# Patient Record
Sex: Male | Born: 1995 | Race: Black or African American | Hispanic: No | Marital: Married | Smoking: Never smoker
Health system: Southern US, Community
[De-identification: ages and names within clinical notes are randomized; demographics above are authoritative.]

## PROBLEM LIST (undated history)

## (undated) DIAGNOSIS — F419 Anxiety disorder, unspecified: Secondary | ICD-10-CM

---

## 2015-07-16 ENCOUNTER — Emergency Department (HOSPITAL_BASED_OUTPATIENT_CLINIC_OR_DEPARTMENT_OTHER)
Admission: EM | Admit: 2015-07-16 | Discharge: 2015-07-16 | Disposition: A | Discharge status: Home / Self Care | Payer: Medicaid - Out of State | Attending: Emergency Medicine | Admitting: Emergency Medicine

## 2015-07-16 ENCOUNTER — Encounter (HOSPITAL_BASED_OUTPATIENT_CLINIC_OR_DEPARTMENT_OTHER): Payer: Self-pay | Admitting: *Deleted

## 2015-07-16 ENCOUNTER — Emergency Department (HOSPITAL_BASED_OUTPATIENT_CLINIC_OR_DEPARTMENT_OTHER): Payer: Medicaid - Out of State

## 2015-07-16 DIAGNOSIS — R42 Dizziness and giddiness: Secondary | ICD-10-CM | POA: Diagnosis not present

## 2015-07-16 DIAGNOSIS — R202 Paresthesia of skin: Secondary | ICD-10-CM | POA: Insufficient documentation

## 2015-07-16 DIAGNOSIS — R200 Anesthesia of skin: Secondary | ICD-10-CM | POA: Diagnosis present

## 2015-07-16 LAB — BASIC METABOLIC PANEL
ANION GAP: 6 (ref 5–15)
BUN: 10 mg/dL (ref 6–20)
CO2: 29 mmol/L (ref 22–32)
Calcium: 8.8 mg/dL — ABNORMAL LOW (ref 8.9–10.3)
Chloride: 100 mmol/L — ABNORMAL LOW (ref 101–111)
Creatinine, Ser: 0.95 mg/dL (ref 0.61–1.24)
GFR calc Af Amer: >60 mL/min (ref >60)
Glucose, Bld: 94 mg/dL (ref 65–99)
POTASSIUM: 3.6 mmol/L (ref 3.5–5.1)
SODIUM: 135 mmol/L (ref 135–145)

## 2015-07-16 LAB — CBC
HEMATOCRIT: 42 % (ref 39.0–52.0)
HEMOGLOBIN: 14.3 g/dL (ref 13.0–17.0)
MCH: 30.2 pg (ref 26.0–34.0)
MCHC: 34 g/dL (ref 30.0–36.0)
MCV: 88.6 fL (ref 78.0–100.0)
Platelets: 196 10*3/uL (ref 150–400)
RBC: 4.74 MIL/uL (ref 4.22–5.81)
RDW: 12 % (ref 11.5–15.5)
WBC: 3.5 10*3/uL — AB (ref 4.0–10.5)

## 2015-07-16 LAB — TROPONIN I: Troponin I: <0.03 ng/mL (ref <0.031)

## 2015-07-16 NOTE — ED Notes (Signed)
Pt's grip equal but weak, pt reported feeling like was going to pass out while holding arms out at 90 degrees, no drift noted; facial symmetry present

## 2015-07-16 NOTE — ED Provider Notes (Signed)
CSN: [id number]     Arrival date & time 07/16/15  1605 History   First MD Initiated Contact with Patient 07/16/15 1735     Chief Complaint  Patient presents with  . Numbness     (Consider location/radiation/quality/duration/timing/severity/associated sxs/prior Treatment) HPI Comments: Patient with no significant past medical history presents with complaints of acute onset of paresthesias in his face, left arm, left chest, left leg approximately 3 PM after getting out of the shower. Symptoms lasted for about 30 minutes. Patient was able to get up and walk and talk with his wife at this time. He did have some discomfort in the left side of his chest that he describes as "feeling like he was going to collapse". He denies any pain. Denies headache or blurry vision/vision loss. No neck pain, neck injuries, manipulations. He states that he has never had tingling like this before, however but had similar chest symptoms 4 years ago. No family history of heart disease or stroke, especially at young age. Symptoms are now completely resolved except for residual tingling in the fingers. No treatments prior to arrival. Patient denies signs of stroke including: facial droop, slurred speech, aphasia, profound weakness in extremities, imbalance/trouble walking.   The history is provided by the patient.    History reviewed. No pertinent past medical history. History reviewed. No pertinent past surgical history. No family history on file. Social History  Substance Use Topics  . Smoking status: Never Smoker   . Smokeless tobacco: Never Used  . Alcohol Use: No    Review of Systems  Constitutional: Negative for fever.  HENT: Negative for congestion, dental problem, rhinorrhea and sinus pressure.   Eyes: Negative for photophobia, discharge, redness and visual disturbance.  Respiratory: Negative for shortness of breath.   Cardiovascular: Negative for chest pain.  Gastrointestinal: Negative for nausea and  vomiting.  Musculoskeletal: Negative for gait problem, neck pain and neck stiffness.  Skin: Negative for rash.  Neurological: Positive for light-headedness and numbness (paresthesias, no true numbness). Negative for syncope, speech difficulty, weakness and headaches.  Psychiatric/Behavioral: Negative for confusion.      Allergies  Review of patient's allergies indicates no known allergies.  Home Medications   Prior to Admission medications   Not on File   BP 122/75 mmHg  Pulse 57  Temp(Src) 98.1 F (36.7 C) (Oral)  Resp 18  Ht  (1.626 m)  Wt 58.06 kg  BMI 21.96 kg/m2  SpO2 99% Physical Exam  Constitutional: He is oriented to person, place, and time. He appears well-developed and well-nourished.  HENT:  Head: Normocephalic and atraumatic.  Right Ear: Tympanic membrane, external ear and ear canal normal.  Left Ear: Tympanic membrane, external ear and ear canal normal.  Nose: Nose normal.  Mouth/Throat: Uvula is midline, oropharynx is clear and moist and mucous membranes are normal.  Eyes: Conjunctivae, EOM and lids are normal. Pupils are equal, round, and reactive to light.  Neck: Normal range of motion. Neck supple.  Cardiovascular: Normal rate and regular rhythm.   Pulmonary/Chest: Effort normal and breath sounds normal.  Abdominal: Soft. There is no tenderness.  Musculoskeletal: Normal range of motion.       Cervical back: He exhibits normal range of motion, no tenderness and no bony tenderness.  Neurological: He is alert and oriented to person, place, and time. He has normal strength and normal reflexes. No cranial nerve deficit or sensory deficit. He exhibits normal muscle tone. He displays a negative Romberg sign. Coordination and gait normal.  GCS eye subscore is 4. GCS verbal subscore is 5. GCS motor subscore is 6.  Skin: Skin is warm and dry.  Psychiatric: He has a normal mood and affect.  Nursing note and vitals reviewed.   ED Course  Procedures (including  critical care time) Labs Review Labs Reviewed  BASIC METABOLIC PANEL - Abnormal; Notable for the following:    Chloride 100 (*)    Calcium 8.8 (*)    All other components within normal limits  CBC - Abnormal; Notable for the following:    WBC 3.5 (*)    All other components within normal limits  TROPONIN I    Imaging Review No results found. I have personally reviewed and evaluated these images and lab results as part of my medical decision-making.   EKG Interpretation   Date/Time:  Saturday July 16 2015 16:44:54 EST Ventricular Rate:  58 PR Interval:  203 QRS Duration: 83 QT Interval:  400 QTC Calculation: 393 R Axis:   64 Text Interpretation:  Sinus rhythm Borderline prolonged PR interval ST  elev, probable normal early repol pattern No old tracing to compare  Confirmed by [facility]  MD, MARTHA [phone]) on 07/16/2015 5:11:17 PM       5:47 PM Patient seen and examined. Work-up initiated.    Vital signs reviewed and are as follows: BP 122/75 mmHg  Pulse 57  Temp(Src) 98.1 F (36.7 C) (Oral)  Resp 18  Ht  (1.626 m)  Wt 58.06 kg  BMI 21.96 kg/m2  SpO2 99%  Discussed work-up with Dr. Karma Ganja who agrees with plan. Low suspicion for CNS cause such as CVA. No focal deficits or risk factors. As symptoms are resolved, feel d/c with PCP f/u is reasonable. Pt is comfortable with this plan as well. Waiting results of chest x-ray. Will need PCP referral as patient has recently moved to the area from [place].  7:11 PM Symptoms not completely resolved. Discussed findings with patient. He is comfortable with discharge to home at this time. He will follow-up with PCP.  Patient counseled to return if they have weakness in their arms or legs, slurred speech, trouble walking or talking, confusion, trouble with their balance, or if they have any other concerns. Patient verbalizes understanding and agrees with plan.    MDM   Final diagnoses:  Paresthesias   Patient with  episode of paresthesias involving left side of body. No significant weakness. No focal neurological deficits on exam. No neck pain. Patient has no risk factors or underlying history which would make him at risk for a stroke at such a young age. Vital signs are within normal limits. Patient appears well, nontoxic. EKG, chest x-ray, troponin labs are normal. At this point, feel patient is safe for discharge to home with PCP follow-up, return as above.   Renne Crigler, PA-C 07/16/15 1913  Jerelyn Scott, MD 07/16/15 [phone]

## 2015-07-16 NOTE — Discharge Instructions (Signed)
Please read and follow all provided instructions.  Your diagnoses today include:  1. Paresthesias     Tests performed today include:  Blood cell counts and electrolytes - normal  Blood test for heart muscle damage - normal  EKG - normal  Chest x-ray - normal  Vital signs. See below for your results today.   Medications:   None  Take any prescribed medications only as directed.  Additional information:  Follow any educational materials contained in this packet.  Follow-up instructions: Please follow-up with your primary care provider in the next 7 days for further evaluation of your symptoms.   Return instructions:   Please return to the Emergency Department if you experience worsening symptoms.  RETURN IMMEDIATELY IF you:  Develop a sudden, severe headache  Develop confusion or become poorly responsive or faint  Develop a fever above 100.34F or problem breathing  Have a change in speech, vision, swallowing, or understanding  Develop new weakness, numbness, tingling, incoordination in your arms or legs  Have a seizure  Please return if you have any other emergent concerns.  Additional Information:  Your vital signs today were: BP 100/69 mmHg   Pulse 63   Temp(Src) 98.1 F (36.7 C) (Oral)   Resp 17   Ht  (1.626 m)   Wt 58.06 kg   BMI 21.96 kg/m2   SpO2 99% If your blood pressure (BP) was elevated above 135/85 this visit, please have this repeated by your doctor within one month. --------------

## 2015-07-16 NOTE — ED Notes (Signed)
Pt reports he was laying down with his son; states he felt dehydrated so he started drinking water. Then left side of body became numb. States he began feeling "woozy" and "very tired". States numbness has resolved except for left hand. Pt ambulated to triage with steady gait

## 2017-03-05 IMAGING — DX DG CHEST 2V
2 series · 2 of 2 positions shown · non-contrast
Comparison: None.

CLINICAL DATA: Near syncope today, left-sided body numbness

EXAM:
CHEST  2 VIEW

[chest pa]
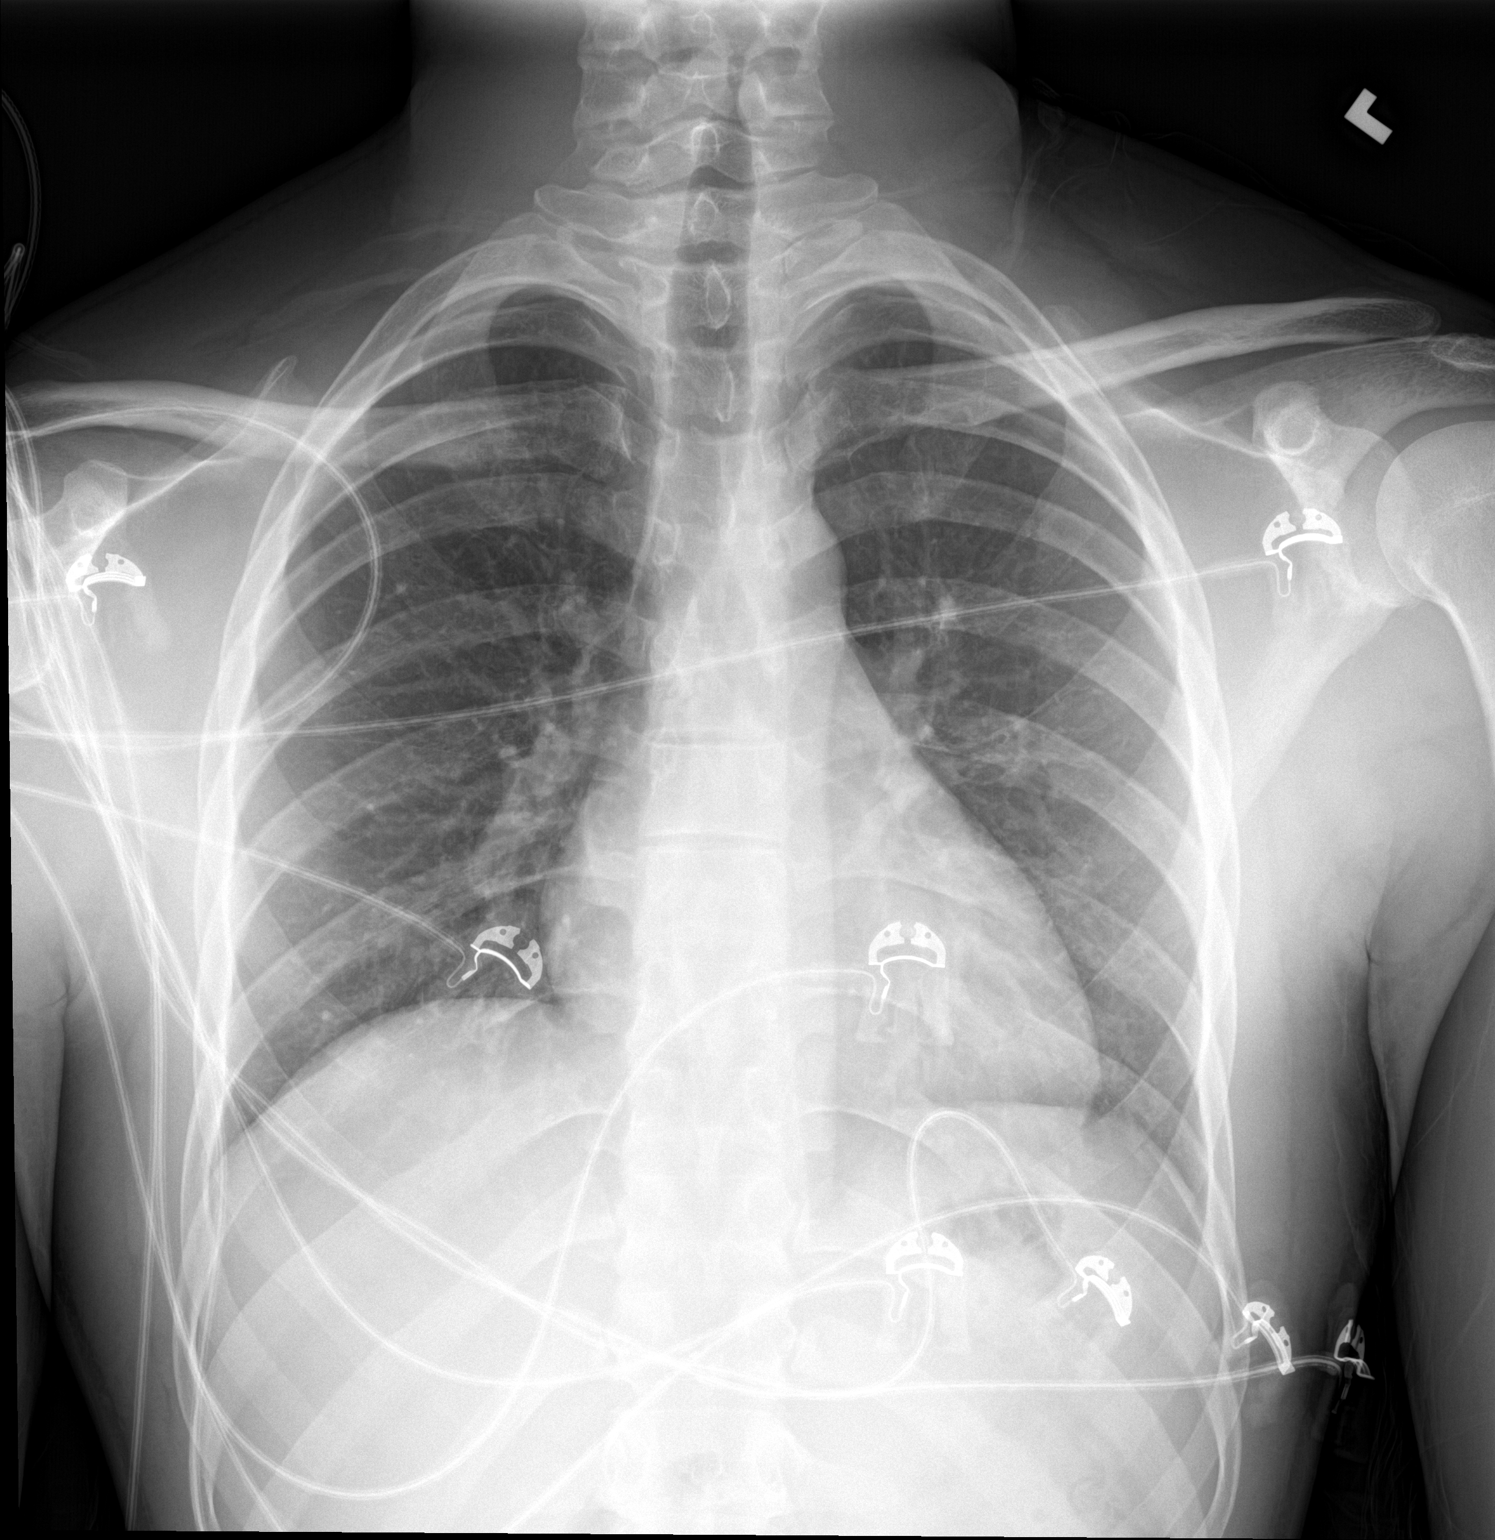

[chest lat]
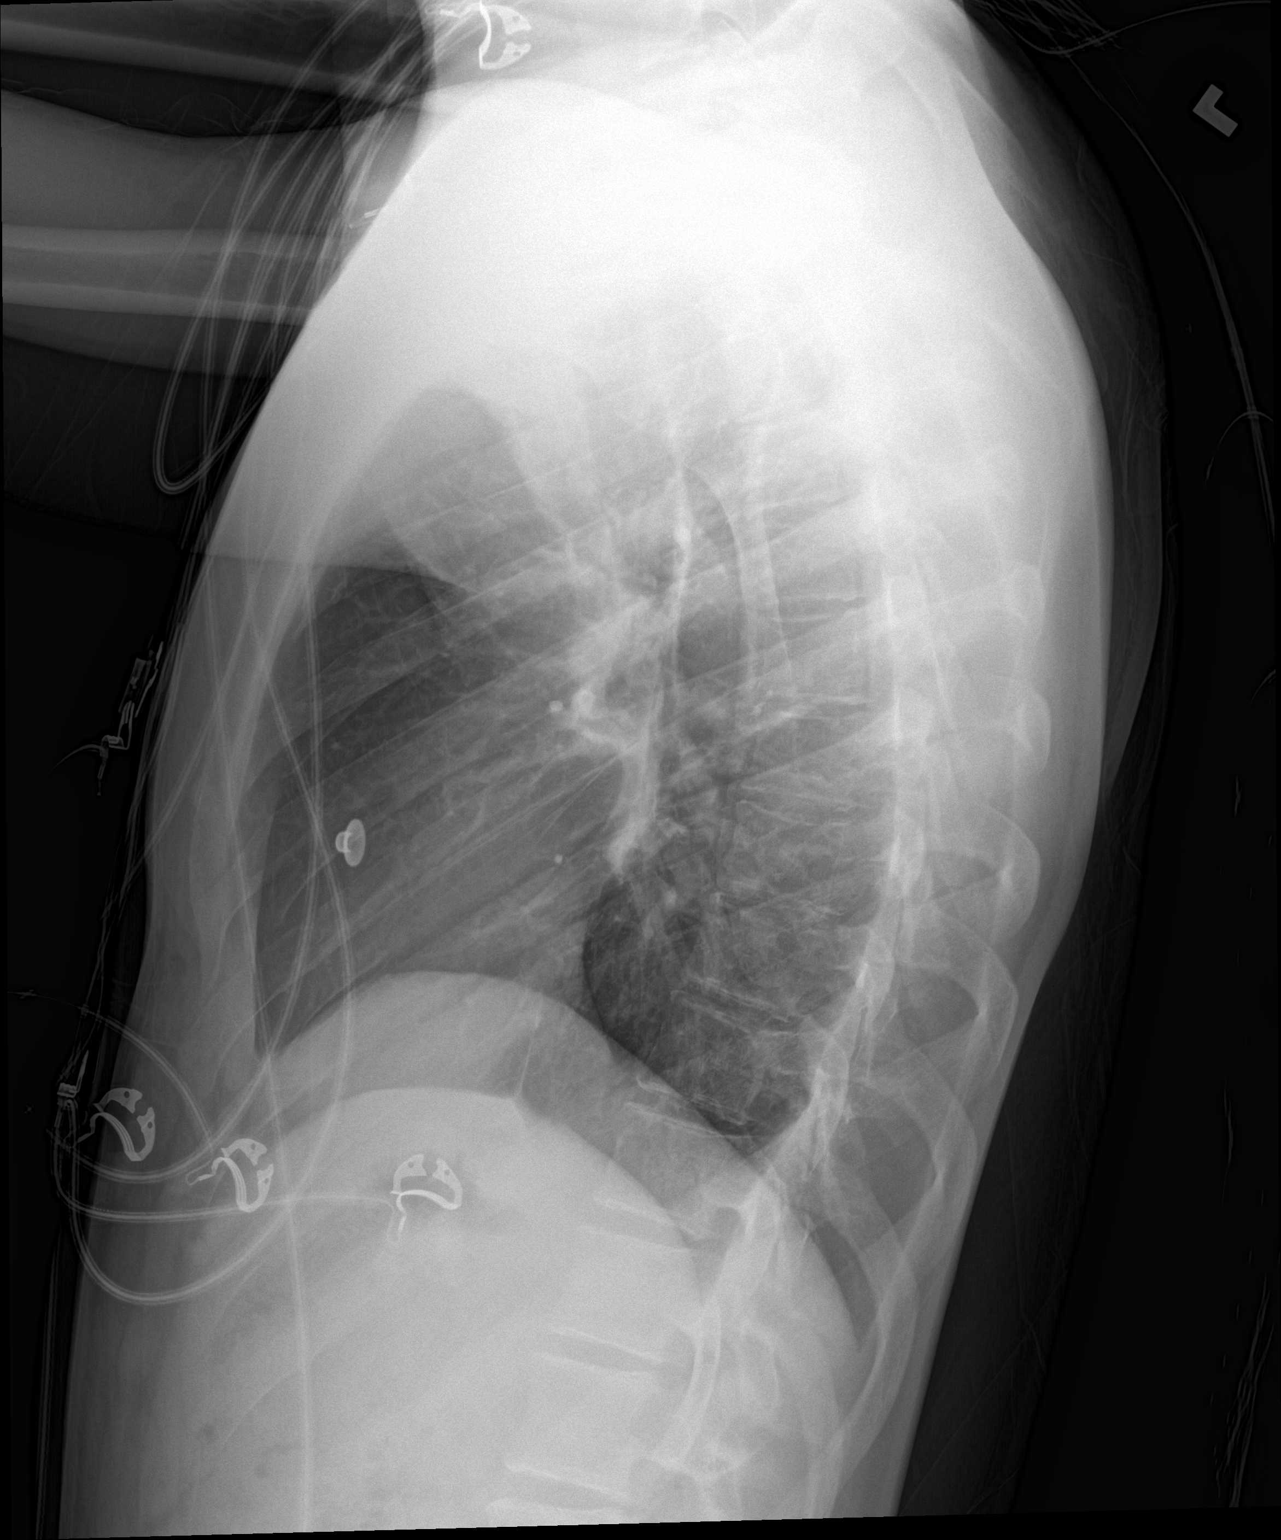

[2 of 2 positions shown; findings below may reference images not displayed]

FINDINGS: The heart size and mediastinal contours are within normal limits.
Both lungs are clear. The visualized skeletal structures are
unremarkable.
IMPRESSION: No active cardiopulmonary disease.

## 2022-06-05 ENCOUNTER — Other Ambulatory Visit: Payer: Self-pay

## 2022-06-05 ENCOUNTER — Emergency Department (HOSPITAL_BASED_OUTPATIENT_CLINIC_OR_DEPARTMENT_OTHER)
Admission: EM | Admit: 2022-06-05 | Discharge: 2022-06-06 | Disposition: A | Discharge status: Home / Self Care | Payer: Medicaid Other | Attending: Emergency Medicine | Admitting: Emergency Medicine

## 2022-06-05 ENCOUNTER — Encounter (HOSPITAL_BASED_OUTPATIENT_CLINIC_OR_DEPARTMENT_OTHER): Payer: Self-pay

## 2022-06-05 ENCOUNTER — Emergency Department (HOSPITAL_BASED_OUTPATIENT_CLINIC_OR_DEPARTMENT_OTHER): Payer: Medicaid Other

## 2022-06-05 DIAGNOSIS — R000 Tachycardia, unspecified: Secondary | ICD-10-CM | POA: Diagnosis not present

## 2022-06-05 DIAGNOSIS — R059 Cough, unspecified: Secondary | ICD-10-CM | POA: Diagnosis present

## 2022-06-05 DIAGNOSIS — U071 COVID-19: Secondary | ICD-10-CM | POA: Diagnosis not present

## 2022-06-05 HISTORY — DX: Anxiety disorder, unspecified: F41.9

## 2022-06-05 LAB — COMPREHENSIVE METABOLIC PANEL
ALT: 15 U/L (ref 0–44)
AST: 27 U/L (ref 15–41)
Albumin: 3.9 g/dL (ref 3.5–5.0)
Alkaline Phosphatase: 43 U/L (ref 38–126)
Anion gap: 9 (ref 5–15)
BUN: 10 mg/dL (ref 6–20)
CO2: 26 mmol/L (ref 22–32)
Calcium: 8.9 mg/dL (ref 8.9–10.3)
Chloride: 96 mmol/L — ABNORMAL LOW (ref 98–111)
Creatinine, Ser: 1.19 mg/dL (ref 0.61–1.24)
GFR, Estimated: >60 mL/min (ref >60)
Glucose, Bld: 105 mg/dL — ABNORMAL HIGH (ref 70–99)
Potassium: 3.9 mmol/L (ref 3.5–5.1)
Sodium: 131 mmol/L — ABNORMAL LOW (ref 135–145)
Total Bilirubin: 0.7 mg/dL (ref 0.3–1.2)
Total Protein: 7.4 g/dL (ref 6.5–8.1)

## 2022-06-05 LAB — RESP PANEL BY RT-PCR (RSV, FLU A&B, COVID)  RVPGX2
Influenza A by PCR: NEGATIVE
Influenza B by PCR: NEGATIVE
Resp Syncytial Virus by PCR: NEGATIVE
SARS Coronavirus 2 by RT PCR: POSITIVE — AB

## 2022-06-05 LAB — CBC WITH DIFFERENTIAL/PLATELET
Abs Immature Granulocytes: 0.02 10*3/uL (ref 0.00–0.07)
Basophils Absolute: 0 10*3/uL (ref 0.0–0.1)
Basophils Relative: 0 %
Eosinophils Absolute: 0 10*3/uL (ref 0.0–0.5)
Eosinophils Relative: 0 %
HCT: 40.3 % (ref 39.0–52.0)
Hemoglobin: 14 g/dL (ref 13.0–17.0)
Immature Granulocytes: 0 %
Lymphocytes Relative: 6 %
Lymphs Abs: 0.3 10*3/uL — ABNORMAL LOW (ref 0.7–4.0)
MCH: 30.6 pg (ref 26.0–34.0)
MCHC: 34.7 g/dL (ref 30.0–36.0)
MCV: 88 fL (ref 80.0–100.0)
Monocytes Absolute: 0.6 10*3/uL (ref 0.1–1.0)
Monocytes Relative: 9 %
Neutro Abs: 5.2 10*3/uL (ref 1.7–7.7)
Neutrophils Relative %: 85 %
Platelets: 221 10*3/uL (ref 150–400)
RBC: 4.58 MIL/uL (ref 4.22–5.81)
RDW: 11.6 % (ref 11.5–15.5)
WBC: 6.2 10*3/uL (ref 4.0–10.5)
nRBC: 0 % (ref 0.0–0.2)

## 2022-06-05 LAB — LACTIC ACID, PLASMA
Lactic Acid, Venous: 1.4 mmol/L (ref 0.5–1.9)
Lactic Acid, Venous: 1.3 mmol/L (ref 0.5–1.9)

## 2022-06-05 LAB — TROPONIN I (HIGH SENSITIVITY)
Troponin I (High Sensitivity): 2 ng/L (ref <18)
Troponin I (High Sensitivity): 3 ng/L (ref <18)

## 2022-06-05 LAB — RAPID URINE DRUG SCREEN, HOSP PERFORMED
Amphetamines: NOT DETECTED
Barbiturates: NOT DETECTED
Benzodiazepines: NOT DETECTED
Cocaine: NOT DETECTED
Opiates: NOT DETECTED
Tetrahydrocannabinol: NOT DETECTED

## 2022-06-05 LAB — D-DIMER, QUANTITATIVE: D-Dimer, Quant: 0.32 ug/mL-FEU (ref 0.00–0.50)

## 2022-06-05 MED ORDER — ACETAMINOPHEN 500 MG PO TABS
1000.0000 mg | ORAL_TABLET | ORAL | Status: AC
  Administered 2022-06-05: 1000 mg via ORAL
  Filled 2022-06-05: qty 2

## 2022-06-05 MED ORDER — LORAZEPAM 1 MG PO TABS
1.0000 mg | ORAL_TABLET | Freq: Once | ORAL | Status: AC
  Administered 2022-06-05: 1 mg via ORAL
  Filled 2022-06-05: qty 1

## 2022-06-05 MED ORDER — LACTATED RINGERS IV BOLUS
1000.0000 mL | Freq: Once | INTRAVENOUS | Status: AC
  Administered 2022-06-05: 1000 mL via INTRAVENOUS

## 2022-06-05 MED ORDER — LACTATED RINGERS IV BOLUS (SEPSIS)
1000.0000 mL | Freq: Once | INTRAVENOUS | Status: AC
  Administered 2022-06-05: 1000 mL via INTRAVENOUS

## 2022-06-05 MED ORDER — KETOROLAC TROMETHAMINE 15 MG/ML IJ SOLN
15.0000 mg | Freq: Once | INTRAMUSCULAR | Status: AC
  Administered 2022-06-05: 15 mg via INTRAVENOUS
  Filled 2022-06-05: qty 1

## 2022-06-05 NOTE — ED Triage Notes (Addendum)
States tested positive for covid last week with rapid test. States symptoms improved. Now c/o fever, cough, headache, neck pain starting today. States has had fever of 103. Took tylenol at 1300.  Pt anxious in triage, HR 150's

## 2022-06-05 NOTE — ED Notes (Signed)
Patient ambulatory to BR

## 2022-06-05 NOTE — ED Notes (Signed)
Patient transported to X-ray 

## 2022-06-05 NOTE — ED Provider Notes (Signed)
Blood pressure 121/65, pulse (!) 138, temperature (!) 100.9 F (38.3 C), temperature source Oral, resp. rate 19, height 5\' 4"  (1.626 m), weight 65.8 kg, SpO2 100 %.  Assuming care from Dr. Philip Aspen.  In short, Alan Austin is a 27 y.o. male with a chief complaint of Cough .  Refer to the original H&P for additional details.  The current plan of care is to follow up on UDS and reassess.  12:33 AM  TSH normal. Tachycardia mildly improved with IVF and fever reduction. UDS negative including for cocaine. Plan for metoprolol and reassess. He reports feeling "much better" and describes feeling ready for d/c. I do not appreciate clinical findings on exam or history to strongly suspect myocarditis. Labs are also reassuring.    EKG Interpretation  Date/Time:  Tuesday June 05 2022 18:24:26 EST Ventricular Rate:  144 PR Interval:  137 QRS Duration: 78 QT Interval:  389 QTC Calculation: 603 R Axis:   96 Text Interpretation: Sinus tachycardia Consider right atrial enlargement Borderline right axis deviation Borderline ST elevation, anterior leads Prolonged QT interval Confirmed by Margaretmary Eddy [phone]) on 06/05/2022 11:33:49 PM           Hosanna Betley, Wonda Olds, MD 06/06/22 [phone]

## 2022-06-05 NOTE — ED Provider Notes (Signed)
[facility] EMERGENCY DEPARTMENT Provider Note   CSN: [id number] Arrival date & time: 06/05/22  1802     History {Add pertinent medical, surgical, social history, OB history to HPI:1} Chief Complaint  Patient presents with   Cough    Alan Austin is a 27 y.o. male.  27 year old male with history of anxiety presents emergency department with fever, cough, headache, and myalgias.  1 week ago patient started developing the above symptoms.  Says he was diagnosed with COVID and got acutely worse today.  Says that he feels very fatigued.  Also has substernal chest pressure that is not exertional or pleuritic.  Says that when he came to the emergency department felt very anxious and also felt short of breath.  Says that it feels similar to prior episodes of anxiety but declines any medications for panic attacks at this time.  Denies any nausea or vomiting or diarrhea.  No lower extremity swelling or history of DVT/PE.  Did take Tylenol at 1 PM just prior to arrival.  Drink 2 cups of coffee today.  No other energy drinks.  No heavy alcohol use.  No illicit substance use.       Home Medications Prior to Admission medications   Not on File      Allergies    Patient has no known allergies.    Review of Systems   Review of Systems  Physical Exam Updated Vital Signs BP (!) 135/101 (BP Location: Left Arm)   Pulse (!) 161   Temp (!) 101.5 F (38.6 C) (Oral)   Resp 20   Ht 5\' 4"  (1.626 m)   Wt 65.8 kg   SpO2 100%   BMI 24.89 kg/m  Physical Exam Vitals and nursing note reviewed.  Constitutional:      General: He is not in acute distress.    Appearance: He is well-developed.  HENT:     Head: Normocephalic and atraumatic.     Right Ear: External ear normal.     Left Ear: External ear normal.     Nose: Nose normal.  Eyes:     Extraocular Movements: Extraocular movements intact.     Conjunctiva/sclera: Conjunctivae normal.     Pupils: Pupils are equal, round, and  reactive to light.  Neck:     Comments: No meningismus Cardiovascular:     Rate and Rhythm: Regular rhythm. Tachycardia present.     Heart sounds: Normal heart sounds.  Pulmonary:     Effort: Pulmonary effort is normal. No respiratory distress.     Breath sounds: Normal breath sounds.  Abdominal:     General: There is no distension.     Palpations: Abdomen is soft. There is no mass.     Tenderness: There is no abdominal tenderness. There is no guarding.  Musculoskeletal:     Cervical back: Normal range of motion and neck supple.     Right lower leg: No edema.     Left lower leg: No edema.  Skin:    General: Skin is warm and dry.  Neurological:     Mental Status: He is alert. Mental status is at baseline.  Psychiatric:        Mood and Affect: Mood normal.        Behavior: Behavior normal.     ED Results / Procedures / Treatments   Labs (all labs ordered are listed, but only abnormal results are displayed) Labs Reviewed  RESP PANEL BY RT-PCR (RSV, FLU A&B, COVID)  [id number]  EKG EKG Interpretation  Date/Time:  Tuesday June 05 2022 18:24:26 EST Ventricular Rate:  144 PR Interval:  137 QRS Duration: 78 QT Interval:  389 QTC Calculation: 603 R Axis:   96 Text Interpretation: Sinus tachycardia Consider right atrial enlargement Borderline right axis deviation Borderline ST elevation, anterior leads Prolonged QT interval Confirmed by Margaretmary Eddy [phone]) on 06/05/2022 6:27:13 PM  Radiology No results found.  Procedures Procedures  {Document cardiac monitor, telemetry assessment procedure when appropriate:1}  Medications Ordered in ED Medications - No data to display  ED Course/ Medical Decision Making/ A&P                           Medical Decision Making Amount and/or Complexity of Data Reviewed Labs: ordered. Radiology: ordered.  Risk OTC drugs. Prescription drug management.   ***  {Document critical care time when appropriate:1} {Document review  of labs and clinical decision tools ie heart score, Chads2Vasc2 etc:1}  {Document your independent review of radiology images, and any outside records:1} {Document your discussion with family members, caretakers, and with consultants:1} {Document social determinants of health affecting pt's care:1} {Document your decision making why or why not admission, treatments were needed:1} Final Clinical Impression(s) / ED Diagnoses Final diagnoses:  None    Rx / DC Orders ED Discharge Orders     None

## 2022-06-06 LAB — TSH: TSH: 0.616 u[IU]/mL (ref 0.350–4.500)

## 2022-06-06 MED ORDER — IBUPROFEN 800 MG PO TABS: 800.0000 mg | ORAL_TABLET | Freq: Three times a day (TID) | ORAL | 0 refills | Status: AC | PRN

## 2022-06-06 MED ORDER — METOPROLOL TARTRATE 5 MG/5ML IV SOLN
5.0000 mg | Freq: Once | INTRAVENOUS | Status: AC
  Administered 2022-06-06: 5 mg via INTRAVENOUS
  Filled 2022-06-06: qty 5

## 2022-06-06 MED ORDER — BENZONATATE 100 MG PO CAPS: 100.0000 mg | ORAL_CAPSULE | Freq: Three times a day (TID) | ORAL | 0 refills | Status: AC | PRN

## 2022-06-06 MED ORDER — FLUTICASONE PROPIONATE 50 MCG/ACT NA SUSP: 2.0000 | Freq: Every day | NASAL | 0 refills | Status: AC

## 2022-06-06 NOTE — Discharge Instructions (Signed)
You were seen in the emergency room today with COVID-19.  Your workup here is reassuring.  Your heart rate was elevated measures finding to fever reduction, IV fluids, other medications.  Please establish care with a primary care physician.  I would like for you to have follow-up regarding your elevated heart rate to see if additional testing is needed, although I do not think it is required emergently.   Return to the emergency department immediately if you develop chest pain, trouble breathing, severe fatigue, or other sudden/severe symptoms.

## 2023-06-04 ENCOUNTER — Encounter (HOSPITAL_BASED_OUTPATIENT_CLINIC_OR_DEPARTMENT_OTHER): Payer: Self-pay | Admitting: Urology

## 2023-06-04 ENCOUNTER — Other Ambulatory Visit: Payer: Self-pay

## 2023-06-04 DIAGNOSIS — K0889 Other specified disorders of teeth and supporting structures: Secondary | ICD-10-CM | POA: Diagnosis present

## 2023-06-04 DIAGNOSIS — K029 Dental caries, unspecified: Secondary | ICD-10-CM | POA: Diagnosis not present

## 2023-06-04 DIAGNOSIS — K0520 Aggressive periodontitis, unspecified: Secondary | ICD-10-CM | POA: Insufficient documentation

## 2023-06-04 NOTE — ED Triage Notes (Signed)
 Pt states right side dental pain x 2 weeks  Has dentist appointment tomorrow States took ibuprofen with no relief  States headache now from tooth

## 2023-06-05 ENCOUNTER — Emergency Department (HOSPITAL_BASED_OUTPATIENT_CLINIC_OR_DEPARTMENT_OTHER)
Admission: EM | Admit: 2023-06-05 | Discharge: 2023-06-05 | Disposition: A | Discharge status: Home / Self Care | Payer: Medicaid Other | Attending: Emergency Medicine | Admitting: Emergency Medicine

## 2023-06-05 DIAGNOSIS — K0889 Other specified disorders of teeth and supporting structures: Secondary | ICD-10-CM

## 2023-06-05 MED ORDER — NAPROXEN 500 MG PO TABS: 500.0000 mg | ORAL_TABLET | Freq: Two times a day (BID) | ORAL | 0 refills | Status: AC

## 2023-06-05 MED ORDER — NAPROXEN 250 MG PO TABS
500.0000 mg | ORAL_TABLET | Freq: Once | ORAL | Status: AC
  Administered 2023-06-05: 500 mg via ORAL
  Filled 2023-06-05: qty 2

## 2023-06-05 NOTE — ED Provider Notes (Signed)
 [place] [facility] AT [facility]  Provider Note  CSN: [id number] Arrival date & time: 06/04/23 2150  History Chief Complaint  Patient presents with   Dental Pain    Alan Austin is a 28 y.o. male reports 1-2 weeks of toothache most severe in R upper 3rd molar. He went to a walk-in dental clinic in the morning of arrival and was given a course of Abx and an appointment with a dentist tomorrow morning. He reports OTC pain medications not helping his pain. No fever.    Home Medications Prior to Admission medications   Medication Sig Start Date End Date Taking? Authorizing Provider  naproxen  (NAPROSYN ) 500 MG tablet Take 1 tablet (500 mg total) by mouth 2 (two) times daily. 06/05/23  Yes Roselyn Carlin NOVAK, MD  benzonatate  (TESSALON ) 100 MG capsule Take 1 capsule (100 mg total) by mouth 3 (three) times daily as needed for cough. 06/06/22   Long, Fonda MATSU, MD  fluticasone  (FLONASE ) 50 MCG/ACT nasal spray Place 2 sprays into both nostrils daily for 7 days. 06/06/22 06/13/22  Long, Fonda MATSU, MD  ibuprofen  (ADVIL ) 800 MG tablet Take 1 tablet (800 mg total) by mouth every 8 (eight) hours as needed for moderate pain, headache or fever. 06/06/22   Long, Joshua G, MD     Allergies    Patient has no known allergies.   Review of Systems   Review of Systems Please see HPI for pertinent positives and negatives  Physical Exam BP (!) 131/114 (BP Location: Left Arm)   Pulse 95   Temp 97.7 F (36.5 C)   Resp 18   Ht 5' 4 (1.626 m)   Wt 65.8 kg   SpO2 100%   BMI 24.90 kg/m   Physical Exam Vitals and nursing note reviewed.  HENT:     Head: Normocephalic.     Nose: Nose normal.     Mouth/Throat:     Comments: Multiple carious teeth, some mild pericoronitis of the R upper 3rd molar, no fluctuance or signs of deep tissue infection Eyes:     Extraocular Movements: Extraocular movements intact.  Pulmonary:     Effort: Pulmonary effort is normal.  Musculoskeletal:         General: Normal range of motion.     Cervical back: Neck supple.  Skin:    Findings: No rash (on exposed skin).  Neurological:     Mental Status: He is alert and oriented to person, place, and time.  Psychiatric:        Mood and Affect: Mood normal.     ED Results / Procedures / Treatments   EKG None  Procedures Procedures  Medications Ordered in the ED Medications  naproxen  (NAPROSYN ) tablet 500 mg (has no administration in time range)    Initial Impression and Plan  Patient here with toothache, already on Abx and scheduled to see a dentist in a few hours. Given naprosyn  for pain. RTED for any other concerns.   ED Course       MDM Rules/Calculators/A&P Medical Decision Making Problems Addressed: Pain, dental: acute illness or injury  Risk Prescription drug management.     Final Clinical Impression(s) / ED Diagnoses Final diagnoses:  Pain, dental    Rx / DC Orders ED Discharge Orders          Ordered    naproxen  (NAPROSYN ) 500 MG tablet  2 times daily        06/05/23 0037  Roselyn Carlin NOVAK, MD 06/05/23 [phone]
